# Patient Record
Sex: Female | Born: 1940 | Race: White | Hispanic: No | Marital: Married | Smoking: Never smoker
Health system: Southern US, Community
[De-identification: ages and names within clinical notes are randomized; demographics above are authoritative.]

## PROBLEM LIST (undated history)

## (undated) DIAGNOSIS — I10 Essential (primary) hypertension: Secondary | ICD-10-CM

## (undated) DIAGNOSIS — Z8489 Family history of other specified conditions: Secondary | ICD-10-CM

## (undated) DIAGNOSIS — E119 Type 2 diabetes mellitus without complications: Secondary | ICD-10-CM

## (undated) HISTORY — PX: CHOLECYSTECTOMY: SHX55

---

## 1990-03-21 HISTORY — PX: BREAST BIOPSY: SHX20

## 2016-12-14 ENCOUNTER — Other Ambulatory Visit: Payer: Self-pay | Admitting: Internal Medicine

## 2016-12-14 DIAGNOSIS — Z1231 Encounter for screening mammogram for malignant neoplasm of breast: Secondary | ICD-10-CM

## 2016-12-23 ENCOUNTER — Ambulatory Visit
Admission: RE | Admit: 2016-12-23 | Discharge: 2016-12-23 | Disposition: A | Discharge status: Home / Self Care | Payer: Medicare Other | Source: Ambulatory Visit | Attending: Internal Medicine | Admitting: Internal Medicine

## 2016-12-23 ENCOUNTER — Encounter: Payer: Self-pay | Admitting: Radiology

## 2016-12-23 DIAGNOSIS — Z1231 Encounter for screening mammogram for malignant neoplasm of breast: Secondary | ICD-10-CM | POA: Diagnosis present

## 2016-12-28 ENCOUNTER — Other Ambulatory Visit: Payer: Self-pay | Admitting: *Deleted

## 2016-12-28 ENCOUNTER — Inpatient Hospital Stay
Admission: RE | Admit: 2016-12-28 | Discharge: 2016-12-28 | Disposition: A | Discharge status: Home / Self Care | Payer: Self-pay | Source: Ambulatory Visit | Attending: *Deleted | Admitting: *Deleted

## 2016-12-28 DIAGNOSIS — Z9289 Personal history of other medical treatment: Secondary | ICD-10-CM

## 2017-02-25 ENCOUNTER — Emergency Department: Payer: Medicare Other

## 2017-02-25 ENCOUNTER — Emergency Department
Admission: EM | Admit: 2017-02-25 | Discharge: 2017-02-25 | Disposition: A | Discharge status: Home / Self Care | Payer: Medicare Other | Attending: Emergency Medicine | Admitting: Emergency Medicine

## 2017-02-25 DIAGNOSIS — W010XXA Fall on same level from slipping, tripping and stumbling without subsequent striking against object, initial encounter: Secondary | ICD-10-CM | POA: Diagnosis not present

## 2017-02-25 DIAGNOSIS — S52532A Colles' fracture of left radius, initial encounter for closed fracture: Secondary | ICD-10-CM | POA: Diagnosis not present

## 2017-02-25 DIAGNOSIS — S52612A Displaced fracture of left ulna styloid process, initial encounter for closed fracture: Secondary | ICD-10-CM | POA: Diagnosis not present

## 2017-02-25 DIAGNOSIS — Y998 Other external cause status: Secondary | ICD-10-CM | POA: Insufficient documentation

## 2017-02-25 DIAGNOSIS — Y9239 Other specified sports and athletic area as the place of occurrence of the external cause: Secondary | ICD-10-CM | POA: Insufficient documentation

## 2017-02-25 DIAGNOSIS — Y9354 Activity, bowling: Secondary | ICD-10-CM | POA: Diagnosis not present

## 2017-02-25 DIAGNOSIS — S6992XA Unspecified injury of left wrist, hand and finger(s), initial encounter: Secondary | ICD-10-CM | POA: Diagnosis present

## 2017-02-25 MED ORDER — HYDROCODONE-ACETAMINOPHEN 5-325 MG PO TABS: 1.0000 | ORAL_TABLET | ORAL | 0 refills | Status: DC | PRN

## 2017-02-25 MED ORDER — HYDROCODONE-ACETAMINOPHEN 5-325 MG PO TABS
1.0000 | ORAL_TABLET | Freq: Once | ORAL | Status: DC
  Filled 2017-02-25: qty 1

## 2017-02-25 NOTE — ED Notes (Signed)
Pt verbalizes understanding of the splinting process and acknowledges the adverse events that "can" occur with the splinting process,. Pt has been educated on what to do if any adverse events occur while the splint is on. Pt states at this time that they understand the splint related education and have no further questions at this time. Pt states that the splint feels comfortable/ pts O2 stats were WDL. Cap refill <3secs

## 2017-02-25 NOTE — ED Provider Notes (Signed)
[facility] Emergency Department Provider Note  ____________________________________________  Time seen: Approximately 10:02 PM  I have reviewed the triage vital signs and the nursing notes.   HISTORY  Chief Complaint Wrist Pain    HPI Jamie Fry is a 76 y.o. female who presents the emergency department complaining of left wrist pain and swelling.  Patient was bowling when she tripped, fell backwards landing on an outstretched hand.  Patient reports that she had swelling and pain to the distal radius.  Patient still has good range of motion to the left hand, no numbness or tingling to any of the digits.  In her fall, patient denied hit her head or lose consciousness.  Denies any other pain complaints at this time.  No medications were taken prior to arrival.  History reviewed. No pertinent past medical history.  There are no active problems to display for this patient.   Past Surgical History:  Procedure Laterality Date  . BREAST BIOPSY Left 1992   negative  . CHOLECYSTECTOMY      Prior to Admission medications   Medication Sig Start Date End Date Taking? Authorizing Provider  HYDROcodone-acetaminophen (NORCO/VICODIN) 5-325 MG tablet Take 1 tablet by mouth every 4 (four) hours as needed for moderate pain. 02/25/17   Urania Pearlman, Delorise RoyalsJonathan D, PA-C    Allergies Patient has no known allergies.  No family history on file.  Social History Social History   Tobacco Use  . Smoking status: Never Smoker  . Smokeless tobacco: Never Used  Substance Use Topics  . Alcohol use: No    Frequency: Never  . Drug use: No     Review of Systems  Constitutional: No fever/chills Eyes: No visual changes.  Cardiovascular: no chest pain. Respiratory: no cough. No SOB. Gastrointestinal: No abdominal pain.  No nausea, no vomiting.   Musculoskeletal: Positive for left wrist pain Skin: Negative for rash, abrasions, lacerations, ecchymosis. Neurological: Negative  for headaches, focal weakness or numbness. 10-point ROS otherwise negative.  ____________________________________________   PHYSICAL EXAM:  VITAL SIGNS: ED Triage Vitals  Enc Vitals Group     BP 02/25/17 2126 (!) 187/90     Pulse Rate 02/25/17 2126 92     Resp 02/25/17 2126 15     Temp 02/25/17 2126 (!) 97.5 F (36.4 C)     Temp src --      SpO2 02/25/17 2126 98 %     Weight 02/25/17 2127 136 lb (61.7 kg)     Height 02/25/17 2127 5\' 3"  (1.6 m)     Head Circumference --      Peak Flow --      Pain Score 02/25/17 2126 2     Pain Loc --      Pain Edu? --      Excl. in GC? --      Constitutional: Alert and oriented. Well appearing and in no acute distress. Eyes: Conjunctivae are normal. PERRL. EOMI. Head: Atraumatic. Neck: No stridor.    Cardiovascular: Normal rate, regular rhythm. Normal S1 and S2.  Good peripheral circulation. Respiratory: Normal respiratory effort without tachypnea or retractions. Lungs CTAB. Good air entry to the bases with no decreased or absent breath sounds. Musculoskeletal: Full range of motion to all extremities. No gross deformities appreciated.  Patient does have mild edema noted to the distal left forearm.  No gross deformity or ecchymosis.  Patient still has good range of motion to the left wrist and all digits left hand.  Patient is tender to  palpation over the distal radius with no palpable abnormality.  No other tenderness to palpation.  Palpation over the carpal bones reveals no tenderness.  Capillary refill intact all 5 digits.  Sensation intact all 5 digits. Neurologic:  Normal speech and language. No gross focal neurologic deficits are appreciated.  Skin:  Skin is warm, dry and intact. No rash noted. Psychiatric: Mood and affect are normal. Speech and behavior are normal. Patient exhibits appropriate insight and judgement.   ____________________________________________   LABS (all labs ordered are listed, but only abnormal results are  displayed)  Labs Reviewed - No data to display ____________________________________________  EKG   ____________________________________________  RADIOLOGY Festus Barren Alieu Finnigan, personally viewed and evaluated these images (plain radiographs) as part of my medical decision making, as well as reviewing the written report by the radiologist.  Dg Wrist Complete Left  Result Date: 02/25/2017 CLINICAL DATA:  Slip [place] fall [place] bowling [place]. EXAM: LEFT WRIST - COMPLETE 3+ VIEW COMPARISON:  None. FINDINGS: Acute transverse fracture through distal radius with suspected intra-articular extension, impaction with dorsal angulation distal bony fragments. Acute minimally displaced ulnar styloid fracture. No dislocation. No destructive bony lesions. Osteopenia. Dorsal wrist soft tissue swelling without subcutaneous gas or radiopaque foreign bodies. IMPRESSION: Acute displaced distal radioulnar styloid fractures. No dislocation. Electronically Signed   By: Awilda Metro M.D.   On: 02/25/2017 22:09    ____________________________________________    PROCEDURES  Procedure(s) performed:    .Splint Application Date/Time: 02/25/2017 10:20 PM Performed by: Racheal Patches, PA-C Authorized by: Racheal Patches, PA-C   Consent:    Consent obtained:  Verbal   Consent given by:  Patient   Risks discussed:  Pain and swelling Pre-procedure details:    Sensation:  Normal Procedure details:    Laterality:  Left   Location:  Wrist   Wrist:  L wrist   Splint type:  Volar short arm   Supplies:  Cotton padding, Ortho-Glass and elastic bandage Post-procedure details:    Pain:  Improved   Sensation:  Normal   Patient tolerance of procedure:  Tolerated well, no immediate complications      Medications  HYDROcodone-acetaminophen (NORCO/VICODIN) 5-325 MG per tablet 1 tablet (not administered)     ____________________________________________   INITIAL IMPRESSION / ASSESSMENT AND  PLAN / ED COURSE  Pertinent labs & imaging results that were available during my care of the patient were reviewed by me and considered in my medical decision making (see chart for details).  Review of the [place] CSRS was performed in accordance of the [facility] prior to dispensing any controlled drugs.     Patient's diagnosis is consistent with fall resulting in distal radius and ulnar styloid fractures.  Differential included sprain versus contusion versus fracture.  X-ray reveals the above fractures.  Patient is in relatively limited amount of pain at this time.  Patient's wrist is splinted as described above.. Patient will be discharged home with prescriptions for Vicodin for pain relief.  She is to follow-up with orthopedics for further management of distal radius and ulnar styloid fracture..  Patient is given ED precautions to return to the ED for any worsening or new symptoms.     ____________________________________________  FINAL CLINICAL IMPRESSION(S) / ED DIAGNOSES  Final diagnoses:  Closed Colles' fracture of left radius, initial encounter  Closed displaced fracture of styloid process of left ulna, initial encounter      NEW MEDICATIONS STARTED DURING THIS VISIT:  ED Discharge Orders  Ordered    HYDROcodone-acetaminophen (NORCO/VICODIN) 5-325 MG tablet  Every 4 hours PRN     02/25/17 2218          This chart was dictated using voice recognition software/Dragon. Despite best efforts to proofread, errors can occur which can change the meaning. Any change was purely unintentional.    Racheal PatchesCuthriell, Lilit Cinelli D, PA-C 02/25/17 2220    Emily FilbertWilliams, Stepan Verrette E, MD 02/25/17 2306

## 2017-02-25 NOTE — ED Triage Notes (Addendum)
Patient reports she slipped at bowling alley and fell back onto left hand. Patient c/o left wrist pain. Swelling noted to area.

## 2017-03-02 ENCOUNTER — Ambulatory Visit: Payer: Medicare Other | Admitting: Anesthesiology

## 2017-03-02 ENCOUNTER — Encounter
Admission: RE | Disposition: A | Discharge status: Home / Self Care | Payer: Self-pay | Source: Ambulatory Visit | Attending: Orthopedic Surgery

## 2017-03-02 ENCOUNTER — Encounter: Payer: Self-pay | Admitting: Emergency Medicine

## 2017-03-02 ENCOUNTER — Ambulatory Visit
Admission: RE | Admit: 2017-03-02 | Discharge: 2017-03-02 | Disposition: A | Discharge status: Home / Self Care | Payer: Medicare Other | Source: Ambulatory Visit | Attending: Orthopedic Surgery | Admitting: Orthopedic Surgery

## 2017-03-02 ENCOUNTER — Ambulatory Visit: Payer: Medicare Other

## 2017-03-02 DIAGNOSIS — Z7984 Long term (current) use of oral hypoglycemic drugs: Secondary | ICD-10-CM | POA: Insufficient documentation

## 2017-03-02 DIAGNOSIS — Z9889 Other specified postprocedural states: Secondary | ICD-10-CM

## 2017-03-02 DIAGNOSIS — Z7982 Long term (current) use of aspirin: Secondary | ICD-10-CM | POA: Insufficient documentation

## 2017-03-02 DIAGNOSIS — Z79899 Other long term (current) drug therapy: Secondary | ICD-10-CM | POA: Insufficient documentation

## 2017-03-02 DIAGNOSIS — E119 Type 2 diabetes mellitus without complications: Secondary | ICD-10-CM | POA: Diagnosis not present

## 2017-03-02 DIAGNOSIS — I10 Essential (primary) hypertension: Secondary | ICD-10-CM | POA: Diagnosis not present

## 2017-03-02 DIAGNOSIS — Y998 Other external cause status: Secondary | ICD-10-CM | POA: Diagnosis not present

## 2017-03-02 DIAGNOSIS — Y9354 Activity, bowling: Secondary | ICD-10-CM | POA: Diagnosis not present

## 2017-03-02 DIAGNOSIS — S52572A Other intraarticular fracture of lower end of left radius, initial encounter for closed fracture: Secondary | ICD-10-CM | POA: Insufficient documentation

## 2017-03-02 DIAGNOSIS — Y9239 Other specified sports and athletic area as the place of occurrence of the external cause: Secondary | ICD-10-CM | POA: Insufficient documentation

## 2017-03-02 DIAGNOSIS — W010XXA Fall on same level from slipping, tripping and stumbling without subsequent striking against object, initial encounter: Secondary | ICD-10-CM | POA: Insufficient documentation

## 2017-03-02 DIAGNOSIS — Z8781 Personal history of (healed) traumatic fracture: Secondary | ICD-10-CM

## 2017-03-02 HISTORY — DX: Essential (primary) hypertension: I10

## 2017-03-02 HISTORY — DX: Type 2 diabetes mellitus without complications: E11.9

## 2017-03-02 HISTORY — PX: OPEN REDUCTION INTERNAL FIXATION (ORIF) DISTAL RADIAL FRACTURE: SHX5989

## 2017-03-02 LAB — GLUCOSE, CAPILLARY
GLUCOSE-CAPILLARY: 98 mg/dL (ref 65–99)
Glucose-Capillary: 91 mg/dL (ref 65–99)

## 2017-03-02 SURGERY — OPEN REDUCTION INTERNAL FIXATION (ORIF) DISTAL RADIUS FRACTURE
Anesthesia: General | Site: Wrist | Laterality: Left | Wound class: Clean

## 2017-03-02 MED ORDER — LIDOCAINE HCL (CARDIAC) 20 MG/ML IV SOLN
INTRAVENOUS | Status: DC | PRN
  Administered 2017-03-02: 100 mg via INTRAVENOUS

## 2017-03-02 MED ORDER — GLYCOPYRROLATE 0.2 MG/ML IJ SOLN
INTRAMUSCULAR | Status: AC
  Filled 2017-03-02: qty 1

## 2017-03-02 MED ORDER — OXYCODONE HCL 5 MG/5ML PO SOLN: 5.0000 mg | Freq: Once | ORAL | Status: AC | PRN

## 2017-03-02 MED ORDER — FAMOTIDINE 20 MG PO TABS
20.0000 mg | ORAL_TABLET | Freq: Once | ORAL | Status: AC
  Administered 2017-03-02: 20 mg via ORAL

## 2017-03-02 MED ORDER — PROPOFOL 10 MG/ML IV BOLUS
INTRAVENOUS | Status: AC
Start: 2017-03-02 — End: ?
  Filled 2017-03-02: qty 20

## 2017-03-02 MED ORDER — LACTATED RINGERS IV SOLN
INTRAVENOUS | Status: DC | PRN
  Administered 2017-03-02: 16:00:00 via INTRAVENOUS

## 2017-03-02 MED ORDER — ONDANSETRON HCL 4 MG/2ML IJ SOLN
INTRAMUSCULAR | Status: DC | PRN
  Administered 2017-03-02: 4 mg via INTRAVENOUS

## 2017-03-02 MED ORDER — OXYCODONE HCL 5 MG PO TABS
ORAL_TABLET | ORAL | Status: AC
  Filled 2017-03-02: qty 1

## 2017-03-02 MED ORDER — CEFAZOLIN SODIUM-DEXTROSE 2-4 GM/100ML-% IV SOLN
INTRAVENOUS | Status: AC
  Filled 2017-03-02: qty 100

## 2017-03-02 MED ORDER — FENTANYL CITRATE (PF) 100 MCG/2ML IJ SOLN
INTRAMUSCULAR | Status: AC
  Administered 2017-03-02: 50 ug via INTRAVENOUS
  Filled 2017-03-02: qty 2

## 2017-03-02 MED ORDER — ACETAMINOPHEN 10 MG/ML IV SOLN
INTRAVENOUS | Status: AC
  Filled 2017-03-02: qty 100

## 2017-03-02 MED ORDER — SODIUM CHLORIDE 0.9 % IV SOLN
INTRAVENOUS | Status: DC
  Administered 2017-03-02: 14:00:00 via INTRAVENOUS

## 2017-03-02 MED ORDER — GLYCOPYRROLATE 0.2 MG/ML IJ SOLN
INTRAMUSCULAR | Status: DC | PRN
  Administered 2017-03-02: 0.2 mg via INTRAVENOUS

## 2017-03-02 MED ORDER — FENTANYL CITRATE (PF) 100 MCG/2ML IJ SOLN
INTRAMUSCULAR | Status: AC
  Filled 2017-03-02: qty 2

## 2017-03-02 MED ORDER — DEXAMETHASONE SODIUM PHOSPHATE 10 MG/ML IJ SOLN
INTRAMUSCULAR | Status: DC | PRN
  Administered 2017-03-02: 10 mg via INTRAVENOUS

## 2017-03-02 MED ORDER — LIDOCAINE HCL (PF) 2 % IJ SOLN
INTRAMUSCULAR | Status: AC
  Filled 2017-03-02: qty 10

## 2017-03-02 MED ORDER — NEOMYCIN-POLYMYXIN B GU 40-200000 IR SOLN
Status: DC | PRN
  Administered 2017-03-02: 2 mL

## 2017-03-02 MED ORDER — FAMOTIDINE 20 MG PO TABS
ORAL_TABLET | ORAL | Status: AC
  Administered 2017-03-02: 20 mg via ORAL
  Filled 2017-03-02: qty 1

## 2017-03-02 MED ORDER — PROPOFOL 10 MG/ML IV BOLUS
INTRAVENOUS | Status: DC | PRN
  Administered 2017-03-02: 50 mg via INTRAVENOUS
  Administered 2017-03-02: 150 mg via INTRAVENOUS

## 2017-03-02 MED ORDER — KETOROLAC TROMETHAMINE 30 MG/ML IJ SOLN
INTRAMUSCULAR | Status: AC
  Filled 2017-03-02: qty 1

## 2017-03-02 MED ORDER — HYDROCODONE-ACETAMINOPHEN 5-325 MG PO TABS: 1.0000 | ORAL_TABLET | Freq: Four times a day (QID) | ORAL | 0 refills | Status: DC | PRN

## 2017-03-02 MED ORDER — KETOROLAC TROMETHAMINE 30 MG/ML IJ SOLN
INTRAMUSCULAR | Status: DC | PRN
  Administered 2017-03-02: 30 mg via INTRAVENOUS

## 2017-03-02 MED ORDER — ACETAMINOPHEN 10 MG/ML IV SOLN
INTRAVENOUS | Status: DC | PRN
  Administered 2017-03-02: 1000 mg via INTRAVENOUS

## 2017-03-02 MED ORDER — CEFAZOLIN SODIUM-DEXTROSE 2-4 GM/100ML-% IV SOLN
2.0000 g | Freq: Once | INTRAVENOUS | Status: AC
  Administered 2017-03-02: 2 g via INTRAVENOUS

## 2017-03-02 MED ORDER — OXYCODONE HCL 5 MG PO TABS
5.0000 mg | ORAL_TABLET | Freq: Once | ORAL | Status: AC | PRN
  Administered 2017-03-02: 5 mg via ORAL

## 2017-03-02 MED ORDER — FENTANYL CITRATE (PF) 100 MCG/2ML IJ SOLN
25.0000 ug | INTRAMUSCULAR | Status: DC | PRN
  Administered 2017-03-02 (×3): 50 ug via INTRAVENOUS

## 2017-03-02 MED ORDER — FENTANYL CITRATE (PF) 100 MCG/2ML IJ SOLN
INTRAMUSCULAR | Status: DC | PRN
  Administered 2017-03-02: 50 ug via INTRAVENOUS
  Administered 2017-03-02: 100 ug via INTRAVENOUS

## 2017-03-02 SURGICAL SUPPLY — 38 items
BANDAGE ACE 4X5 VEL STRL LF (GAUZE/BANDAGES/DRESSINGS) ×3 IMPLANT
BIT DRILL 2 FAST STEP (BIT) ×3 IMPLANT
BIT DRILL 2.5X4 QC (BIT) ×3 IMPLANT
CANISTER SUCT 1200ML W/VALVE (MISCELLANEOUS) ×3 IMPLANT
CHLORAPREP W/TINT 26ML (MISCELLANEOUS) ×3 IMPLANT
CUFF TOURN 18 STER (MISCELLANEOUS) IMPLANT
DRAPE FLUOR MINI C-ARM 54X84 (DRAPES) ×3 IMPLANT
ELECT REM PT RETURN 9FT ADLT (ELECTROSURGICAL) ×3
ELECTRODE REM PT RTRN 9FT ADLT (ELECTROSURGICAL) ×1 IMPLANT
GAUZE PETRO XEROFOAM 1X8 (MISCELLANEOUS) ×6 IMPLANT
GAUZE SPONGE 4X4 12PLY STRL (GAUZE/BANDAGES/DRESSINGS) ×3 IMPLANT
GLOVE SURG SYN 9.0  PF PI (GLOVE) ×2
GLOVE SURG SYN 9.0 PF PI (GLOVE) ×1 IMPLANT
GOWN SRG 2XL LVL 4 RGLN SLV (GOWNS) ×1 IMPLANT
GOWN STRL NON-REIN 2XL LVL4 (GOWNS) ×2
GOWN STRL REUS W/ TWL LRG LVL3 (GOWN DISPOSABLE) ×1 IMPLANT
GOWN STRL REUS W/TWL LRG LVL3 (GOWN DISPOSABLE) ×2
K-WIRE 1.6 (WIRE) ×2
K-WIRE FX5X1.6XNS BN SS (WIRE) ×1
KIT RM TURNOVER STRD PROC AR (KITS) ×3 IMPLANT
KWIRE FX5X1.6XNS BN SS (WIRE) ×1 IMPLANT
NEEDLE FILTER BLUNT 18X 1/2SAF (NEEDLE) ×2
NEEDLE FILTER BLUNT 18X1 1/2 (NEEDLE) ×1 IMPLANT
NS IRRIG 500ML POUR BTL (IV SOLUTION) ×3 IMPLANT
PACK EXTREMITY ARMC (MISCELLANEOUS) ×3 IMPLANT
PAD CAST CTTN 4X4 STRL (SOFTGOODS) ×2 IMPLANT
PADDING CAST COTTON 4X4 STRL (SOFTGOODS) ×4
PEG SUBCHONDRAL SMOOTH 2.0X14 (Peg) ×3 IMPLANT
PEG SUBCHONDRAL SMOOTH 2.0X20 (Peg) ×6 IMPLANT
PEG SUBCHONDRAL SMOOTH 2.0X22 (Peg) ×9 IMPLANT
PLATE SHORT 21.6X48.9 NRRW LT (Plate) ×3 IMPLANT
SCREW CORT 3.5X10 LNG (Screw) ×9 IMPLANT
SPLINT CAST 1 STEP 3X12 (MISCELLANEOUS) ×3 IMPLANT
SUT ETHILON 4-0 (SUTURE) ×2
SUT ETHILON 4-0 FS2 18XMFL BLK (SUTURE) ×1
SUT VICRYL 3-0 27IN (SUTURE) ×3 IMPLANT
SUTURE ETHLN 4-0 FS2 18XMF BLK (SUTURE) ×1 IMPLANT
SYR 3ML LL SCALE MARK (SYRINGE) ×3 IMPLANT

## 2017-03-02 NOTE — Anesthesia Preprocedure Evaluation (Signed)
Anesthesia Evaluation  Patient identified by MRN, date of birth, ID band Patient awake    Reviewed: Allergy & Precautions, H&P , NPO status , Patient's Chart, lab work & pertinent test results  History of Anesthesia Complications Negative for: history of anesthetic complications  Airway Mallampati: III  TM Distance: >3 FB Neck ROM: full    Dental  (+) Chipped   Pulmonary neg pulmonary ROS, neg shortness of breath,           Cardiovascular Exercise Tolerance: Good hypertension, (-) angina(-) Past MI and (-) DOE      Neuro/Psych negative neurological ROS  negative psych ROS   GI/Hepatic negative GI ROS, Neg liver ROS,   Endo/Other  diabetes, Type 2  Renal/GU      Musculoskeletal   Abdominal   Peds  Hematology negative hematology ROS (+)   Anesthesia Other Findings Past Medical History: No date: Diabetes mellitus without complication (HCC) No date: Hypertension  Past Surgical History: 1992: BREAST BIOPSY; Left     Comment:  negative No date: CHOLECYSTECTOMY  BMI    Body Mass Index:  24.09 kg/m      Reproductive/Obstetrics negative OB ROS                             Anesthesia Physical Anesthesia Plan  ASA: III  Anesthesia Plan: General   Post-op Pain Management:    Induction: Intravenous  PONV Risk Score and Plan: 2 and Ondansetron, Dexamethasone and Midazolam  Airway Management Planned: LMA  Additional Equipment:   Intra-op Plan:   Post-operative Plan: Extubation in OR  Informed Consent: I have reviewed the patients History and Physical, chart, labs and discussed the procedure including the risks, benefits and alternatives for the proposed anesthesia with the patient or authorized representative who has indicated his/her understanding and acceptance.   Dental Advisory Given  Plan Discussed with: Anesthesiologist, CRNA and Surgeon  Anesthesia Plan Comments:  (Patient consented for risks of anesthesia including but not limited to:  - adverse reactions to medications - damage to teeth, lips or other oral mucosa - sore throat or hoarseness - Damage to heart, brain, lungs or loss of life  Patient voiced understanding.)        Anesthesia Quick Evaluation

## 2017-03-02 NOTE — Transfer of Care (Signed)
Immediate Anesthesia Transfer of Care Note  Patient: Jamie Fry  Procedure(s) Performed: OPEN REDUCTION INTERNAL FIXATION (ORIF) DISTAL RADIAL FRACTURE (Left Wrist)  Patient Location: PACU  Anesthesia Type:General  Level of Consciousness: sedated  Airway & Oxygen Therapy: Patient Spontanous Breathing and Patient connected to face mask oxygen  Post-op Assessment: Report given to RN and Post -op Vital signs reviewed and stable  Post vital signs: Reviewed and stable  Last Vitals:  Vitals:   03/02/17 1417  BP: (!) 181/84  Pulse: 95  Resp: 17  Temp: 36.7 C  SpO2: 98%    Last Pain:  Vitals:   03/02/17 1417  TempSrc: Oral         Complications: No apparent anesthesia complications

## 2017-03-02 NOTE — Anesthesia Postprocedure Evaluation (Signed)
Anesthesia Post Note  Patient: Jamie Fry  Procedure(s) Performed: OPEN REDUCTION INTERNAL FIXATION (ORIF) DISTAL RADIAL FRACTURE (Left Wrist)  Patient location during evaluation: PACU Anesthesia Type: General Level of consciousness: awake and alert Pain management: pain level controlled Vital Signs Assessment: post-procedure vital signs reviewed and stable Respiratory status: spontaneous breathing, nonlabored ventilation, respiratory function stable and patient connected to nasal cannula oxygen Cardiovascular status: blood pressure returned to baseline and stable Postop Assessment: no apparent nausea or vomiting Anesthetic complications: no     Last Vitals:  Vitals:   03/02/17 1814 03/02/17 1907  BP: (!) 158/84 (!) 157/79  Pulse: 84 74  Resp: 16   Temp: 36.7 C   SpO2: 99%     Last Pain:  Vitals:   03/02/17 1907  TempSrc:   PainSc: 3                  Lenard SimmerAndrew Maly Lemarr

## 2017-03-02 NOTE — Op Note (Signed)
03/02/2017  5:16 PM  PATIENT:  Jamie Fry  76 y.o. female  PRE-OPERATIVE DIAGNOSIS:  CLOSED DISPLACED FRACTURE OF LEFT RADIUS, intra-articular 2 fragments  POST-OPERATIVE DIAGNOSIS:  CLOSED DISPLACED FRACTURE OF LEFT RADIUS same  PROCEDURE:  Procedure(s): OPEN REDUCTION INTERNAL FIXATION (ORIF) DISTAL RADIAL FRACTURE (Left)  SURGEON: Leitha SchullerMichael J Faatimah Spielberg, MD  ASSISTANTS: None  ANESTHESIA:   general  EBL:  Total I/O In: 500 [I.V.:500] Out: 5 [Blood:5]  BLOOD ADMINISTERED:none  DRAINS: none   LOCAL MEDICATIONS USED:  NONE  SPECIMEN:  No Specimen  DISPOSITION OF SPECIMEN:  N/A  COUNTS:  YES  TOURNIQUET:  * Missing tourniquet times found for documented tourniquets in log: [id number] *20 minutes at 250 mmHg  IMPLANTS: Biomet short narrow DVR plate with multiple smooth pegs and screws  DICTATION: .Dragon Dictation patient brought the operating room and after adequate anesthesia was obtained the left arm was prepped and draped in sterile fashion. After patient identification and timeout procedures were completed, tourniquet was raised to her 50 mmHg and traction was applied to the index and middle fingers off the end of the table. A volar approach was utilized centered over the FCR tendon and the tendon sheath incised with the tendon retracted radially. The deep fascia was incised and the pronator elevated off the distal shaft and for distal fragment. With traction applied the reduction still did not get the volar tilt restored so distal first technique was utilized with the plate applied distally and multiple smooth pegs placed into the distal fragment. With the plate was brought down to the shaft anatomic reduction of the joint surface was obtained. Care was taken to visualize the distal radius and oblique fashion so that the was quite certain of the pegs were situated inside the joint. After placing all the smooth pegs using standard technique and putting 310 mm cortical shaft screws  in the traction was removed and under fluoroscopic exam the fracture was stable. The wound was irrigated and tourniquet let down. Wound was closed with 3-0 Vicryl substantially and 4-0 nylon for the skin. Xeroform 4 x 4 web roll and a volar splint were then applied followed by an Ace wrap  PLAN OF CARE: Discharge to home after PACU  PATIENT DISPOSITION:  PACU - hemodynamically stable.

## 2017-03-02 NOTE — Discharge Instructions (Addendum)
Keep arm elevated is much as possible. Ice to the back of the wrist today and tomorrow. Pain medicine as directed. Keep dressing clean and dry. Work fingers is much as you can  AMBULATORY SURGERY  DISCHARGE INSTRUCTIONS   1) The drugs that you were given will stay in your system until tomorrow so for the next 24 hours you should not:  A) Drive an automobile B) Make any legal decisions C) Drink any alcoholic beverage   2) You may resume regular meals tomorrow.  Today it is better to start with liquids and gradually work up to solid foods.  You may eat anything you prefer, but it is better to start with liquids, then soup and crackers, and gradually work up to solid foods.   3) Please notify your doctor immediately if you have any unusual bleeding, trouble breathing, redness and pain at the surgery site, drainage, fever, or pain not relieved by medication.    4) Additional Instructions:        Please contact your physician with any problems or Same Day Surgery at [phone], Monday through Friday 6 am to 4 pm, or [place] at [facility] Main number at [phone].

## 2017-03-02 NOTE — H&P (Signed)
Reviewed paper H+P, will be scanned into chart. No changes noted.  

## 2017-03-02 NOTE — Anesthesia Post-op Follow-up Note (Signed)
Anesthesia QCDR form completed.        

## 2017-03-02 NOTE — Anesthesia Procedure Notes (Signed)
Procedure Name: LMA Insertion Date/Time: 03/02/2017 4:05 PM Performed by: Junious SilkNoles, Kobey Sides, CRNA Pre-anesthesia Checklist: Patient identified, Patient being monitored, Timeout performed, Emergency Drugs available and Suction available Patient Re-evaluated:Patient Re-evaluated prior to induction Oxygen Delivery Method: Circle system utilized Preoxygenation: Pre-oxygenation with 100% oxygen Ventilation: Mask ventilation without difficulty LMA: LMA inserted LMA Size: 4.0 Tube type: Oral Number of attempts: 1 Placement Confirmation: positive ETCO2 and breath sounds checked- equal and bilateral Tube secured with: Tape Dental Injury: Teeth and Oropharynx as per pre-operative assessment

## 2017-03-03 ENCOUNTER — Encounter: Payer: Self-pay | Admitting: Orthopedic Surgery

## 2018-01-09 ENCOUNTER — Other Ambulatory Visit: Payer: Self-pay | Admitting: Internal Medicine

## 2018-01-09 DIAGNOSIS — Z1231 Encounter for screening mammogram for malignant neoplasm of breast: Secondary | ICD-10-CM

## 2018-01-24 ENCOUNTER — Encounter: Payer: Self-pay | Admitting: Urology

## 2018-01-24 ENCOUNTER — Ambulatory Visit
Admission: RE | Admit: 2018-01-24 | Discharge: 2018-01-24 | Disposition: A | Discharge status: Home / Self Care | Payer: Medicare Other | Source: Ambulatory Visit | Attending: Urology | Admitting: Urology

## 2018-01-24 ENCOUNTER — Ambulatory Visit: Payer: Medicare Other | Admitting: Urology

## 2018-01-24 ENCOUNTER — Other Ambulatory Visit: Payer: Self-pay

## 2018-01-24 VITALS — BP 140/87 | HR 114 | Ht 63.0 in | Wt 138.7 lb

## 2018-01-24 DIAGNOSIS — N200 Calculus of kidney: Secondary | ICD-10-CM | POA: Diagnosis not present

## 2018-01-24 LAB — URINALYSIS, COMPLETE
Bilirubin, UA: NEGATIVE
Glucose, UA: NEGATIVE
Ketones, UA: NEGATIVE
Leukocytes, UA: NEGATIVE
NITRITE UA: NEGATIVE
Protein, UA: NEGATIVE
RBC UA: NEGATIVE
SPEC GRAV UA: 1.01 (ref 1.005–1.030)
UUROB: 0.2 mg/dL (ref 0.2–1.0)
pH, UA: 6 (ref 5.0–7.5)

## 2018-01-24 MED ORDER — NITROFURANTOIN MACROCRYSTAL 50 MG PO CAPS: 50.0000 mg | ORAL_CAPSULE | Freq: Every day | ORAL | 3 refills | Status: AC

## 2018-01-24 NOTE — Progress Notes (Signed)
   01/24/2018 3:36 PM   Jamie Fry [birth year] [id number]  Referring provider: Leotis Shames, MD [phone] [facility] [address] [facility] [place], [place] [address]  CC: Nephrolithiasis, recurrent UTIs, urinary urgency  HPI: I had the pleasure of meeting Jamie Fry today in consultation for the above from Dr. Thedore Mins.  She is a very healthy and active 77 year old female who reportedly was told she had a kidney stones in the past however she has never passed any stones or required any surgeries for stones.  There is no imaging available for me to review.  She also reports recurrent UTIs approximately 5/year over the course of the last 30 years.  She typically takes a self start nitrofurantoin when she feels a urinary infection is coming on.  Her typical symptoms are chills and dysuria.  She has reportedly had numerous cystoscopies in the past which were all normal.  She denies any gross hematuria or flank pain.  She also reports urinary urgency with nocturia 3-4 times per night.  She denies any significant stress incontinence.  She is minimally bothered by her urinary urgency.  Her and her husband recently moved to the area from [place].  PVR in clinic today 90 cc.   PMH: Past Medical History:  Diagnosis Date  . Diabetes mellitus without complication (HCC)   . Hypertension     Surgical History: Past Surgical History:  Procedure Laterality Date  . BREAST BIOPSY Left 1992   negative  . CHOLECYSTECTOMY    . OPEN REDUCTION INTERNAL FIXATION (ORIF) DISTAL RADIAL FRACTURE Left 03/02/2017   Procedure: OPEN REDUCTION INTERNAL FIXATION (ORIF) DISTAL RADIAL FRACTURE;  Surgeon: Kennedy Bucker, MD;  Location: [facility] ORS;  Service: Orthopedics;  Laterality: Left;    Allergies: No Known Allergies  Family History: Family History  Problem Relation Age of Onset  . Prostate cancer Neg Hx   . Kidney cancer Neg Hx   . Bladder Cancer Neg Hx     Social History:  reports that she has never  smoked. She has never used smokeless tobacco. She reports that she does not drink alcohol or use drugs.  ROS: Please see flowsheet from today's date for complete review of systems.  Physical Exam: BP 140/87   Pulse (!) 114   Ht 5\' 3"  (1.6 m)   Wt 138 lb 11.2 oz (62.9 kg)   BMI 24.57 kg/m    Constitutional:  Alert and oriented, No acute distress. Cardiovascular: No clubbing, cyanosis, or edema. Respiratory: Normal respiratory effort, no increased work of breathing. GI: Abdomen is soft, nontender, nondistended, no abdominal masses GU: No CVA tenderness Lymph: No cervical or inguinal lymphadenopathy. Skin: No rashes, bruises or suspicious lesions. Neurologic: Grossly intact, no focal deficits, moving all 4 extremities. Psychiatric: Normal mood and affect.  Laboratory Data: Urinalysis today 0 WBCs, 0 RBCs, no bacteria, nitrite negative  Pertinent Imaging: None to review  Assessment & Plan:   In summary, Jamie Fry is a healthy and active 77 year old female with reported history of kidney stones as well as recurrent UTIs.  We discussed strategies for prevention of UTIs at length including hydration, timed voiding, perineal hygiene, and cranberry supplementation.  Trial of Premarin cream for recurrent UTIs, prescription provided for 50 mg Macrodantin prophylaxis KUB to evaluate for stone burden  Sondra Come, MD  [facility] [address], [address] [place], [place] [address] [phone]

## 2018-01-25 ENCOUNTER — Telehealth: Payer: Self-pay | Admitting: Family Medicine

## 2018-01-25 NOTE — Telephone Encounter (Signed)
-----   Message from Sondra Come, MD sent at 01/25/2018  9:33 AM EST ----- She has some small stones in the right kidney. I agree we can just watch these and do not need to worry about them unless she develops new right sided flank or groin pain. Keeps scheduled follow up.  Legrand Rams, MD 01/25/2018

## 2018-01-25 NOTE — Telephone Encounter (Signed)
Patient notified and voiced understanding.

## 2018-02-07 ENCOUNTER — Ambulatory Visit
Admission: RE | Admit: 2018-02-07 | Discharge: 2018-02-07 | Disposition: A | Discharge status: Home / Self Care | Payer: Medicare Other | Source: Ambulatory Visit | Attending: Internal Medicine | Admitting: Internal Medicine

## 2018-02-07 DIAGNOSIS — Z1231 Encounter for screening mammogram for malignant neoplasm of breast: Secondary | ICD-10-CM | POA: Diagnosis not present

## 2019-01-22 ENCOUNTER — Ambulatory Visit: Payer: Medicare Other | Admitting: Urology

## 2019-02-07 ENCOUNTER — Ambulatory Visit: Payer: Medicare Other | Admitting: Urology

## 2019-02-18 ENCOUNTER — Other Ambulatory Visit: Payer: Self-pay

## 2019-02-18 DIAGNOSIS — Z20822 Contact with and (suspected) exposure to covid-19: Secondary | ICD-10-CM

## 2019-02-20 LAB — NOVEL CORONAVIRUS, NAA: SARS-CoV-2, NAA: NOT DETECTED

## 2019-02-21 ENCOUNTER — Telehealth: Payer: Self-pay | Admitting: Internal Medicine

## 2019-02-21 NOTE — Telephone Encounter (Signed)
Negative COVID results given. Patient results "NOT Detected." Caller expressed understanding. ° °

## 2019-05-02 ENCOUNTER — Ambulatory Visit: Payer: Medicare Other | Attending: Internal Medicine

## 2019-05-02 DIAGNOSIS — Z23 Encounter for immunization: Secondary | ICD-10-CM | POA: Insufficient documentation

## 2019-05-02 NOTE — Progress Notes (Signed)
   Covid-19 Vaccination Clinic  Name:  Jamie Fry    MRN: [id number] DOB: 19-Dec-1940  05/02/2019  Jamie Fry was observed post Covid-19 immunization for 15 minutes without incidence. She was provided with Vaccine Information Sheet and instruction to access the V-Safe system.   Jamie Fry was instructed to call 911 with any severe reactions post vaccine: Marland Kitchen Difficulty breathing  . Swelling of your face and throat  . A fast heartbeat  . A bad rash all over your body  . Dizziness and weakness    Immunizations Administered    Name Date Dose VIS Date Route   Pfizer COVID-19 Vaccine 05/02/2019  1:41 PM 0.3 mL 03/01/2019 Intramuscular   Manufacturer: [employer], [employer]   Lot: [id number]   NDC: [address]-4

## 2019-05-25 ENCOUNTER — Ambulatory Visit: Payer: Medicare Other | Attending: Internal Medicine

## 2019-05-25 DIAGNOSIS — Z23 Encounter for immunization: Secondary | ICD-10-CM | POA: Insufficient documentation

## 2019-05-25 NOTE — Progress Notes (Signed)
   Covid-19 Vaccination Clinic  Name:  Jamie Fry    MRN: [id number] DOB: Jun 04, 1940  05/25/2019  [name] was observed post Covid-19 immunization for 15 minutes without incident. She was provided with Vaccine Information Sheet and instruction to access the V-Safe system.   [name] was instructed to call 911 with any severe reactions post vaccine: Marland Kitchen Difficulty breathing  . Swelling of face and throat  . A fast heartbeat  . A bad rash all over body  . Dizziness and weakness   Immunizations Administered    Name Date Dose VIS Date Route   Pfizer COVID-19 Vaccine 05/25/2019 12:32 PM 0.3 mL 03/01/2019 Intramuscular   Manufacturer: [employer], [employer]   Lot: [id number]   NDC: [address]-9

## 2019-12-30 ENCOUNTER — Other Ambulatory Visit: Payer: Self-pay | Admitting: Internal Medicine

## 2019-12-30 DIAGNOSIS — Z1231 Encounter for screening mammogram for malignant neoplasm of breast: Secondary | ICD-10-CM

## 2019-12-31 ENCOUNTER — Ambulatory Visit
Admission: RE | Admit: 2019-12-31 | Discharge: 2019-12-31 | Disposition: A | Discharge status: Home / Self Care | Payer: Medicare Other | Source: Ambulatory Visit | Attending: Internal Medicine | Admitting: Internal Medicine

## 2019-12-31 ENCOUNTER — Other Ambulatory Visit: Payer: Self-pay

## 2019-12-31 DIAGNOSIS — Z1231 Encounter for screening mammogram for malignant neoplasm of breast: Secondary | ICD-10-CM | POA: Insufficient documentation

## 2021-05-19 DIAGNOSIS — U071 COVID-19: Secondary | ICD-10-CM

## 2021-05-19 HISTORY — DX: COVID-19: U07.1

## 2021-07-01 ENCOUNTER — Encounter: Payer: Self-pay | Admitting: Ophthalmology

## 2021-07-05 NOTE — Discharge Instructions (Signed)

## 2021-07-07 ENCOUNTER — Ambulatory Visit: Payer: Medicare Other | Admitting: Anesthesiology

## 2021-07-07 ENCOUNTER — Encounter
Admission: RE | Disposition: A | Discharge status: Home / Self Care | Payer: Self-pay | Source: Home / Self Care | Attending: Ophthalmology

## 2021-07-07 ENCOUNTER — Ambulatory Visit
Admission: RE | Admit: 2021-07-07 | Discharge: 2021-07-07 | Disposition: A | Discharge status: Home / Self Care | Payer: Medicare Other | Attending: Ophthalmology | Admitting: Ophthalmology

## 2021-07-07 ENCOUNTER — Other Ambulatory Visit: Payer: Self-pay

## 2021-07-07 ENCOUNTER — Encounter: Payer: Self-pay | Admitting: Ophthalmology

## 2021-07-07 DIAGNOSIS — H2511 Age-related nuclear cataract, right eye: Secondary | ICD-10-CM | POA: Diagnosis present

## 2021-07-07 DIAGNOSIS — E1136 Type 2 diabetes mellitus with diabetic cataract: Secondary | ICD-10-CM | POA: Insufficient documentation

## 2021-07-07 DIAGNOSIS — I10 Essential (primary) hypertension: Secondary | ICD-10-CM | POA: Diagnosis not present

## 2021-07-07 HISTORY — PX: CATARACT EXTRACTION W/PHACO: SHX586

## 2021-07-07 HISTORY — DX: Family history of other specified conditions: Z84.89

## 2021-07-07 LAB — GLUCOSE, CAPILLARY
Glucose-Capillary: 125 mg/dL — ABNORMAL HIGH (ref 70–99)
Glucose-Capillary: 116 mg/dL — ABNORMAL HIGH (ref 70–99)

## 2021-07-07 SURGERY — PHACOEMULSIFICATION, CATARACT, WITH IOL INSERTION
Anesthesia: Monitor Anesthesia Care | Site: Eye | Laterality: Right

## 2021-07-07 MED ORDER — ACETAMINOPHEN 325 MG PO TABS: 325.0000 mg | ORAL_TABLET | Freq: Once | ORAL | Status: DC

## 2021-07-07 MED ORDER — SIGHTPATH DOSE#1 BSS IO SOLN
INTRAOCULAR | Status: DC | PRN
  Administered 2021-07-07: 1 mL via INTRAMUSCULAR

## 2021-07-07 MED ORDER — ARMC OPHTHALMIC DILATING DROPS
1.0000 "application " | OPHTHALMIC | Status: DC | PRN
  Administered 2021-07-07 (×3): 1 via OPHTHALMIC

## 2021-07-07 MED ORDER — LACTATED RINGERS IV SOLN: INTRAVENOUS | Status: DC

## 2021-07-07 MED ORDER — FENTANYL CITRATE (PF) 100 MCG/2ML IJ SOLN
INTRAMUSCULAR | Status: DC | PRN
  Administered 2021-07-07: 50 ug via INTRAVENOUS

## 2021-07-07 MED ORDER — TETRACAINE HCL 0.5 % OP SOLN
1.0000 [drp] | OPHTHALMIC | Status: DC | PRN
  Administered 2021-07-07 (×3): 1 [drp] via OPHTHALMIC

## 2021-07-07 MED ORDER — MIDAZOLAM HCL 2 MG/2ML IJ SOLN
INTRAMUSCULAR | Status: DC | PRN
  Administered 2021-07-07: 1 mg via INTRAVENOUS

## 2021-07-07 MED ORDER — SIGHTPATH DOSE#1 BSS IO SOLN
INTRAOCULAR | Status: DC | PRN
  Administered 2021-07-07: 64 mL via OPHTHALMIC

## 2021-07-07 MED ORDER — CEFUROXIME OPHTHALMIC INJECTION 1 MG/0.1 ML
INJECTION | OPHTHALMIC | Status: DC | PRN
  Administered 2021-07-07: 1 mg via OPHTHALMIC

## 2021-07-07 MED ORDER — SIGHTPATH DOSE#1 NA HYALUR & NA CHOND-NA HYALUR IO KIT
PACK | INTRAOCULAR | Status: DC | PRN
  Administered 2021-07-07: 1 via OPHTHALMIC

## 2021-07-07 MED ORDER — SIGHTPATH DOSE#1 BSS IO SOLN
INTRAOCULAR | Status: DC | PRN
  Administered 2021-07-07: 15 mL

## 2021-07-07 MED ORDER — BRIMONIDINE TARTRATE-TIMOLOL 0.2-0.5 % OP SOLN
OPHTHALMIC | Status: DC | PRN
  Administered 2021-07-07: 1 [drp] via OPHTHALMIC

## 2021-07-07 MED ORDER — ACETAMINOPHEN 160 MG/5ML PO SOLN: 325.0000 mg | Freq: Once | ORAL | Status: DC

## 2021-07-07 SURGICAL SUPPLY — 11 items
CATARACT SUITE SIGHTPATH (MISCELLANEOUS) ×2 IMPLANT
FEE CATARACT SUITE SIGHTPATH (MISCELLANEOUS) ×1 IMPLANT
GLOVE SRG 8 PF TXTR STRL LF DI (GLOVE) ×1 IMPLANT
GLOVE SURG ENC TEXT LTX SZ7.5 (GLOVE) ×2 IMPLANT
GLOVE SURG UNDER POLY LF SZ8 (GLOVE) ×2
LENS IOL TECNIS EYHANCE 19.5 (Intraocular Lens) ×1 IMPLANT
NDL FILTER BLUNT 18X1 1/2 (NEEDLE) ×1 IMPLANT
NEEDLE FILTER BLUNT 18X 1/2SAF (NEEDLE) ×1
NEEDLE FILTER BLUNT 18X1 1/2 (NEEDLE) ×1 IMPLANT
SYR 3ML LL SCALE MARK (SYRINGE) ×2 IMPLANT
WATER STERILE IRR 250ML POUR (IV SOLUTION) ×2 IMPLANT

## 2021-07-07 NOTE — H&P (Signed)
?[facility]  ? ?Primary Care Physician:  Lauro Regulus, MD ?Ophthalmologist: Dr. Lockie Mola ? ?Pre-Procedure History & Physical: ?HPI:  Jamie Fry is a 81 y.o. female here for ophthalmic surgery. ?  ?Past Medical History:  ?Diagnosis Date  ? COVID-19 05/2021  ? Diabetes mellitus without complication (HCC)   ? Family history of adverse reaction to anesthesia   ? Mother and daughter - PONV  ? Hypertension   ? ? ?Past Surgical History:  ?Procedure Laterality Date  ? BREAST BIOPSY Left 1992  ? negative  ? CHOLECYSTECTOMY    ? OPEN REDUCTION INTERNAL FIXATION (ORIF) DISTAL RADIAL FRACTURE Left 03/02/2017  ? Procedure: OPEN REDUCTION INTERNAL FIXATION (ORIF) DISTAL RADIAL FRACTURE;  Surgeon: Kennedy Bucker, MD;  Location: [facility] ORS;  Service: Orthopedics;  Laterality: Left;  ? ? ?Prior to Admission medications   ?Medication Sig Start Date End Date Taking? Authorizing Provider  ?aspirin EC 81 MG tablet Take 81 mg by mouth daily.   Yes [provider]  ?atorvastatin (LIPITOR) 10 MG tablet Take 10 mg by mouth every evening.   Yes [provider]  ?CALCIUM PO Take 600 mg by mouth daily.   Yes [provider]  ?Cholecalciferol (VITAMIN D PO) Take 1 capsule by mouth daily.   Yes [provider]  ?Chromium Picolinate 200 MCG TABS Take by mouth daily.   Yes [provider]  ?Coenzyme Q10 (COQ10) 100 MG CAPS Take 100 mg by mouth daily.   Yes [provider]  ?Flaxseed, Linseed, (FLAX SEED OIL) 1300 MG CAPS Take 1,300 mg by mouth daily.   Yes [provider]  ?Glucosamine-Chondroit-Vit C-Mn (GLUCOSAMINE 1500 COMPLEX PO) Take 1 tablet by mouth daily.   Yes [provider]  ?ibuprofen (ADVIL,MOTRIN) 200 MG tablet Take 200-400 mg by mouth daily as needed for headache or moderate pain.   Yes [provider]  ?Lecithin 1200 MG CAPS Take 1,200 mg by mouth daily.   Yes [provider]  ?lisinopril (PRINIVIL,ZESTRIL) 5  MG tablet Take 7.5 mg by mouth daily.   Yes [provider]  ?metFORMIN (GLUCOPHAGE) 500 MG tablet Take 500 mg by mouth daily with breakfast. 01/27/17  Yes [provider]  ?Multiple Vitamin (MULTIVITAMIN WITH MINERALS) TABS tablet Take 1 tablet by mouth daily.   Yes [provider]  ?nitrofurantoin (MACRODANTIN) 50 MG capsule Take 1 capsule (50 mg total) by mouth daily. 01/24/18  Yes Sondra Come, MD  ?Omega-3 Fatty Acids (FISH OIL) 1000 MG CAPS Take 1,000 mg by mouth daily.   Yes [provider]  ?OVER THE COUNTER MEDICATION Beet root 1000 mg   Yes [provider]  ?vitamin C (ASCORBIC ACID) 500 MG tablet Take 500 mg by mouth daily.   Yes [provider]  ?vitamin E 400 UNIT capsule Take 400 Units by mouth daily.   Yes [provider]  ? ? ?Allergies as of 12/23/2020  ? (No Known Allergies)  ? ? ?Family History  ?Problem Relation Age of Onset  ? Prostate cancer Neg Hx   ? Kidney cancer Neg Hx   ? Bladder Cancer Neg Hx   ? Breast cancer Neg Hx   ? ? ?Social History  ? ?Socioeconomic History  ? Marital status: Married  ?  Spouse name: Not on file  ? Number of children: Not on file  ? Years of education: Not on file  ? Highest education level: Not on file  ?Occupational History  ?  Not on file  ?Tobacco Use  ? Smoking status: Never  ? Smokeless tobacco: Never  ?Vaping Use  ? Vaping Use: Never used  ?Substance and Sexual Activity  ? Alcohol use: No  ? Drug use: No  ? Sexual activity: Not on file  ?Other Topics Concern  ? Not on file  ?Social History Narrative  ? Not on file  ? ?Social Determinants of Health  ? ?Financial Resource Strain: Not on file  ?Food Insecurity: Not on file  ?Transportation Needs: Not on file  ?Physical Activity: Not on file  ?Stress: Not on file  ?Social Connections: Not on file  ?Intimate Partner Violence: Not on file  ? ? ?Review of Systems: ?See HPI, otherwise negative ROS ? ?Physical Exam: ?BP (!) 192/98   Pulse 93   Temp  97.9 ?F (36.6 ?C) (Temporal)   Resp 12   Ht 5\' 3"  (1.6 m)   Wt 61.7 kg   SpO2 96%   BMI 24.09 kg/m?  ?General:   Alert,  pleasant and cooperative in NAD ?Head:  Normocephalic and atraumatic. ?Lungs:  Clear to auscultation.    ?Heart:  Regular rate and rhythm.  ? ?Impression/Plan: ?Jamie Fry is here for ophthalmic surgery. ? ?Risks, benefits, limitations, and alternatives regarding ophthalmic surgery have been reviewed with the patient.  Questions have been answered.  All parties agreeable. ? ? Leandrew Koyanagi, MD  07/07/2021, 9:45 AM ? ?

## 2021-07-07 NOTE — Transfer of Care (Signed)
Immediate Anesthesia Transfer of Care Note ? ?Patient: Jamie Fry ? ?Procedure(s) Performed: CATARACT EXTRACTION PHACO AND INTRAOCULAR LENS PLACEMENT (IOC)RIGHT (Right: Eye) ? ?Patient Location: PACU ? ?Anesthesia Type: MAC ? ?Level of Consciousness: awake, alert  and patient cooperative ? ?Airway and Oxygen Therapy: Patient Spontanous Breathing and Patient connected to supplemental oxygen ? ?Post-op Assessment: Post-op Vital signs reviewed, Patient's Cardiovascular Status Stable, Respiratory Function Stable, Patent Airway and No signs of Nausea or vomiting ? ?Post-op Vital Signs: Reviewed and stable ? ?Complications: No notable events documented. ? ?

## 2021-07-07 NOTE — Anesthesia Preprocedure Evaluation (Signed)
Anesthesia Evaluation  ?Patient identified by MRN, date of birth, ID band ?Patient awake ? ? ? ?Reviewed: ?Allergy & Precautions, H&P , NPO status , Patient's Chart, lab work & pertinent test results ? ?Airway ?Mallampati: II ? ?TM Distance: >3 FB ?Neck ROM: full ? ? ? Dental ?no notable dental hx. ? ?  ?Pulmonary ? ?  ?Pulmonary exam normal ?breath sounds clear to auscultation ? ? ? ? ? ? Cardiovascular ?hypertension, Normal cardiovascular exam ?Rhythm:regular Rate:Normal ? ? ?  ?Neuro/Psych ?  ? GI/Hepatic ?  ?Endo/Other  ?diabetes ? Renal/GU ?  ? ?  ?Musculoskeletal ? ? Abdominal ?  ?Peds ? Hematology ?  ?Anesthesia Other Findings ? ? Reproductive/Obstetrics ? ?  ? ? ? ? ? ? ? ? ? ? ? ? ? ?  ?  ? ? ? ? ? ? ? ? ?Anesthesia Physical ?Anesthesia Plan ? ?ASA: 2 ? ?Anesthesia Plan: MAC  ? ?Post-op Pain Management: Minimal or no pain anticipated  ? ?Induction:  ? ?PONV Risk Score and Plan: 2 and Treatment may vary due to age or medical condition and TIVA ? ?Airway Management Planned:  ? ?Additional Equipment:  ? ?Intra-op Plan:  ? ?Post-operative Plan:  ? ?Informed Consent: I have reviewed the patients History and Physical, chart, labs and discussed the procedure including the risks, benefits and alternatives for the proposed anesthesia with the patient or authorized representative who has indicated his/her understanding and acceptance.  ? ? ? ?Dental Advisory Given ? ?Plan Discussed with: CRNA ? ?Anesthesia Plan Comments:   ? ? ? ? ? ? ?Anesthesia Quick Evaluation ? ?

## 2021-07-07 NOTE — Anesthesia Postprocedure Evaluation (Signed)
Anesthesia Post Note ? ?Patient: Jamie Fry ? ?Procedure(s) Performed: CATARACT EXTRACTION PHACO AND INTRAOCULAR LENS PLACEMENT (IOC)RIGHT (Right: Eye) ? ? ?  ?Patient location during evaluation: PACU ?Anesthesia Type: MAC ?Level of consciousness: awake and alert and oriented ?Pain management: satisfactory to patient ?Vital Signs Assessment: post-procedure vital signs reviewed and stable ?Respiratory status: spontaneous breathing, nonlabored ventilation and respiratory function stable ?Cardiovascular status: blood pressure returned to baseline and stable ?Postop Assessment: Adequate PO intake and No signs of nausea or vomiting ?Anesthetic complications: no ? ? ?No notable events documented. ? ?Raliegh Ip ? ? ? ? ? ?

## 2021-07-07 NOTE — Op Note (Signed)
LOCATION:  [place] ?  ?PREOPERATIVE DIAGNOSIS:    Nuclear sclerotic cataract right eye. H25.11 ?  ?POSTOPERATIVE DIAGNOSIS:  Nuclear sclerotic cataract right eye.   ?  ?PROCEDURE:  Phacoemusification with posterior chamber intraocular lens placement of the right eye  ? ?ULTRASOUND TIME: Procedure(s) with comments: ?CATARACT EXTRACTION PHACO AND INTRAOCULAR LENS PLACEMENT (IOC)RIGHT (Right) - Diabetic ?9.55 ?01:04.8 ? ?LENS:   ?Implant Name Type Inv. Item Serial No. Manufacturer Lot No. LRB No. Used Action  ?LENS IOL TECNIS EYHANCE 19.5 - [id number] Intraocular Lens LENS IOL TECNIS EYHANCE 19.5 [id number] [employer]  Right 1 Implanted  ?   ?  ?  ?SURGEON:  Wyonia Hough, MD ?  ?ANESTHESIA:  Topical with tetracaine drops and 2% Xylocaine jelly, augmented with 1% preservative-free intracameral lidocaine. ? ?  ?COMPLICATIONS:  None. ?  ?DESCRIPTION OF PROCEDURE:  The patient was identified in the holding room and transported to the operating room and placed in the supine position under the operating microscope.  The right eye was identified as the operative eye and it was prepped and draped in the usual sterile ophthalmic fashion. ?  ?A 1 millimeter clear-corneal paracentesis was made at the 12:00 position.  0.5 ml of preservative-free 1% lidocaine was injected into the anterior chamber. ?The anterior chamber was filled with Viscoat viscoelastic.  A 2.4 millimeter keratome was used to make a near-clear corneal incision at the 9:00 position.  A curvilinear capsulorrhexis was made with a cystotome and capsulorrhexis forceps.  Balanced salt solution was used to hydrodissect and hydrodelineate the nucleus. ?  ?Phacoemulsification was then used in stop and chop fashion to remove the lens nucleus and epinucleus.  The remaining cortex was then removed using the irrigation and aspiration handpiece. Provisc was then placed into the capsular bag to distend it for lens placement.  A lens was then injected  into the capsular bag.  The remaining viscoelastic was aspirated. ?  ?Wounds were hydrated with balanced salt solution.  The anterior chamber was inflated to a physiologic pressure with balanced salt solution.  No wound leaks were noted. Cefuroxime 0.1 ml of a 10mg /ml solution was injected into the anterior chamber for a dose of 1 mg of intracameral antibiotic at the completion of the case. ?  Timolol and Brimonidine drops were applied to the eye.  The patient was taken to the recovery room in stable condition without complications of anesthesia or surgery. ? ? ?Joeangel Jeanpaul ?07/07/2021, 10:36 AM ? ?

## 2021-07-08 ENCOUNTER — Other Ambulatory Visit: Payer: Self-pay

## 2021-07-08 ENCOUNTER — Encounter: Payer: Self-pay | Admitting: Ophthalmology

## 2021-07-19 NOTE — Discharge Instructions (Signed)

## 2021-07-21 ENCOUNTER — Ambulatory Visit
Admission: RE | Admit: 2021-07-21 | Discharge: 2021-07-21 | Disposition: A | Discharge status: Home / Self Care | Payer: Medicare Other | Attending: Ophthalmology | Admitting: Ophthalmology

## 2021-07-21 ENCOUNTER — Ambulatory Visit: Payer: Medicare Other | Admitting: Anesthesiology

## 2021-07-21 ENCOUNTER — Encounter: Payer: Self-pay | Admitting: Ophthalmology

## 2021-07-21 ENCOUNTER — Other Ambulatory Visit: Payer: Self-pay

## 2021-07-21 ENCOUNTER — Encounter
Admission: RE | Disposition: A | Discharge status: Home / Self Care | Payer: Self-pay | Source: Home / Self Care | Attending: Ophthalmology

## 2021-07-21 DIAGNOSIS — Z7984 Long term (current) use of oral hypoglycemic drugs: Secondary | ICD-10-CM | POA: Insufficient documentation

## 2021-07-21 DIAGNOSIS — I10 Essential (primary) hypertension: Secondary | ICD-10-CM | POA: Insufficient documentation

## 2021-07-21 DIAGNOSIS — E1136 Type 2 diabetes mellitus with diabetic cataract: Secondary | ICD-10-CM | POA: Insufficient documentation

## 2021-07-21 DIAGNOSIS — H2512 Age-related nuclear cataract, left eye: Secondary | ICD-10-CM | POA: Insufficient documentation

## 2021-07-21 HISTORY — PX: CATARACT EXTRACTION W/PHACO: SHX586

## 2021-07-21 LAB — GLUCOSE, CAPILLARY
Glucose-Capillary: 142 mg/dL — ABNORMAL HIGH (ref 70–99)
Glucose-Capillary: 156 mg/dL — ABNORMAL HIGH (ref 70–99)

## 2021-07-21 SURGERY — PHACOEMULSIFICATION, CATARACT, WITH IOL INSERTION
Anesthesia: Monitor Anesthesia Care | Site: Eye | Laterality: Left

## 2021-07-21 MED ORDER — SIGHTPATH DOSE#1 BSS IO SOLN
INTRAOCULAR | Status: DC | PRN
  Administered 2021-07-21: 82 mL via OPHTHALMIC

## 2021-07-21 MED ORDER — SIGHTPATH DOSE#1 NA HYALUR & NA CHOND-NA HYALUR IO KIT
PACK | INTRAOCULAR | Status: DC | PRN
  Administered 2021-07-21: 1 via OPHTHALMIC

## 2021-07-21 MED ORDER — TETRACAINE HCL 0.5 % OP SOLN
1.0000 [drp] | OPHTHALMIC | Status: DC | PRN
  Administered 2021-07-21 (×3): 1 [drp] via OPHTHALMIC

## 2021-07-21 MED ORDER — LACTATED RINGERS IV SOLN: INTRAVENOUS | Status: DC

## 2021-07-21 MED ORDER — ARMC OPHTHALMIC DILATING DROPS
1.0000 "application " | OPHTHALMIC | Status: DC | PRN
  Administered 2021-07-21 (×3): 1 via OPHTHALMIC

## 2021-07-21 MED ORDER — ACETAMINOPHEN 325 MG PO TABS: 325.0000 mg | ORAL_TABLET | Freq: Once | ORAL | Status: DC

## 2021-07-21 MED ORDER — SIGHTPATH DOSE#1 BSS IO SOLN
INTRAOCULAR | Status: DC | PRN
  Administered 2021-07-21: 15 mL

## 2021-07-21 MED ORDER — ACETAMINOPHEN 160 MG/5ML PO SOLN: 325.0000 mg | Freq: Once | ORAL | Status: DC

## 2021-07-21 MED ORDER — BRIMONIDINE TARTRATE-TIMOLOL 0.2-0.5 % OP SOLN
OPHTHALMIC | Status: DC | PRN
  Administered 2021-07-21: 1 [drp] via OPHTHALMIC

## 2021-07-21 MED ORDER — MIDAZOLAM HCL 2 MG/2ML IJ SOLN
INTRAMUSCULAR | Status: DC | PRN
  Administered 2021-07-21: 2 mg via INTRAVENOUS

## 2021-07-21 MED ORDER — FENTANYL CITRATE (PF) 100 MCG/2ML IJ SOLN
INTRAMUSCULAR | Status: DC | PRN
Start: 2021-07-21 — End: 2021-07-21
  Administered 2021-07-21: 50 ug via INTRAVENOUS

## 2021-07-21 MED ORDER — CEFUROXIME OPHTHALMIC INJECTION 1 MG/0.1 ML
INJECTION | OPHTHALMIC | Status: DC | PRN
  Administered 2021-07-21: 1 mg via OPHTHALMIC

## 2021-07-21 MED ORDER — SIGHTPATH DOSE#1 BSS IO SOLN
INTRAOCULAR | Status: DC | PRN
  Administered 2021-07-21: 1 mL via INTRAMUSCULAR

## 2021-07-21 SURGICAL SUPPLY — 11 items
CATARACT SUITE SIGHTPATH (MISCELLANEOUS) ×2 IMPLANT
FEE CATARACT SUITE SIGHTPATH (MISCELLANEOUS) ×1 IMPLANT
GLOVE SRG 8 PF TXTR STRL LF DI (GLOVE) ×1 IMPLANT
GLOVE SURG ENC TEXT LTX SZ7.5 (GLOVE) ×2 IMPLANT
GLOVE SURG UNDER POLY LF SZ8 (GLOVE) ×2
LENS IOL TECNIS EYHANCE 19.0 (Intraocular Lens) ×1 IMPLANT
NDL FILTER BLUNT 18X1 1/2 (NEEDLE) ×1 IMPLANT
NEEDLE FILTER BLUNT 18X 1/2SAF (NEEDLE) ×1
NEEDLE FILTER BLUNT 18X1 1/2 (NEEDLE) ×1 IMPLANT
SYR 3ML LL SCALE MARK (SYRINGE) ×2 IMPLANT
WATER STERILE IRR 250ML POUR (IV SOLUTION) ×2 IMPLANT

## 2021-07-21 NOTE — Anesthesia Preprocedure Evaluation (Signed)
Anesthesia Evaluation  ?Patient identified by MRN, date of birth, ID band ?Patient awake ? ? ? ?Reviewed: ?Allergy & Precautions, H&P , NPO status , Patient's Chart, lab work & pertinent test results ? ?Airway ?Mallampati: II ? ?TM Distance: >3 FB ?Neck ROM: full ? ? ? Dental ?no notable dental hx. ? ?  ?Pulmonary ? ?  ?Pulmonary exam normal ?breath sounds clear to auscultation ? ? ? ? ? ? Cardiovascular ?hypertension, Normal cardiovascular exam ?Rhythm:regular Rate:Normal ? ? ?  ?Neuro/Psych ?  ? GI/Hepatic ?  ?Endo/Other  ?diabetes ? Renal/GU ?  ? ?  ?Musculoskeletal ? ? Abdominal ?  ?Peds ? Hematology ?  ?Anesthesia Other Findings ? ? Reproductive/Obstetrics ? ?  ? ? ? ? ? ? ? ? ? ? ? ? ? ?  ?  ? ? ? ? ? ? ? ? ?Anesthesia Physical ? ?Anesthesia Plan ? ?ASA: 2 ? ?Anesthesia Plan: MAC  ? ?Post-op Pain Management: Minimal or no pain anticipated  ? ?Induction:  ? ?PONV Risk Score and Plan: 2 and Treatment may vary due to age or medical condition and TIVA ? ?Airway Management Planned:  ? ?Additional Equipment:  ? ?Intra-op Plan:  ? ?Post-operative Plan:  ? ?Informed Consent: I have reviewed the patients History and Physical, chart, labs and discussed the procedure including the risks, benefits and alternatives for the proposed anesthesia with the patient or authorized representative who has indicated his/her understanding and acceptance.  ? ? ? ?Dental Advisory Given ? ?Plan Discussed with: CRNA ? ?Anesthesia Plan Comments:   ? ? ? ? ? ? ?Anesthesia Quick Evaluation ? ?

## 2021-07-21 NOTE — Transfer of Care (Signed)
Immediate Anesthesia Transfer of Care Note ? ?Patient: Jamie Fry ? ?Procedure(s) Performed: CATARACT EXTRACTION PHACO AND INTRAOCULAR LENS PLACEMENT (IOC) LEFT diabetic (Left: Eye) ? ?Patient Location: PACU ? ?Anesthesia Type: MAC ? ?Level of Consciousness: awake, alert  and patient cooperative ? ?Airway and Oxygen Therapy: Patient Spontanous Breathing and Patient connected to supplemental oxygen ? ?Post-op Assessment: Post-op Vital signs reviewed, Patient's Cardiovascular Status Stable, Respiratory Function Stable, Patent Airway and No signs of Nausea or vomiting ? ?Post-op Vital Signs: Reviewed and stable ? ?Complications: No notable events documented. ? ?

## 2021-07-21 NOTE — Anesthesia Procedure Notes (Signed)
Procedure Name: [place] ?Date/Time: 07/21/2021 7:40 AM ?Performed by: Jeannene Patella, CRNA ?Pre-anesthesia Checklist: Patient identified, Emergency Drugs available, Suction available, Timeout performed and Patient being monitored ?Patient Re-evaluated:Patient Re-evaluated prior to induction ?Oxygen Delivery Method: Nasal cannula ?Placement Confirmation: positive ETCO2 ? ? ? ? ?

## 2021-07-21 NOTE — H&P (Signed)
?[facility]  ? ?Primary Care Physician:  Lauro Regulus, MD ?Ophthalmologist: Dr. Lockie Mola ? ?Pre-Procedure History & Physical: ?HPI:  Jamie Fry is a 81 y.o. female here for ophthalmic surgery. ?  ?Past Medical History:  ?Diagnosis Date  ? COVID-19 05/2021  ? Diabetes mellitus without complication (HCC)   ? Family history of adverse reaction to anesthesia   ? Mother and daughter - PONV  ? Hypertension   ? ? ?Past Surgical History:  ?Procedure Laterality Date  ? BREAST BIOPSY Left 1992  ? negative  ? CATARACT EXTRACTION W/PHACO Right 07/07/2021  ? Procedure: CATARACT EXTRACTION PHACO AND INTRAOCULAR LENS PLACEMENT (IOC)RIGHT;  Surgeon: Lockie Mola, MD;  Location: [facility];  Service: Ophthalmology;  Laterality: Right;  Diabetic ?9.55 ?01:04.8  ? CHOLECYSTECTOMY    ? OPEN REDUCTION INTERNAL FIXATION (ORIF) DISTAL RADIAL FRACTURE Left 03/02/2017  ? Procedure: OPEN REDUCTION INTERNAL FIXATION (ORIF) DISTAL RADIAL FRACTURE;  Surgeon: Kennedy Bucker, MD;  Location: [facility] ORS;  Service: Orthopedics;  Laterality: Left;  ? ? ?Prior to Admission medications   ?Medication Sig Start Date End Date Taking? Authorizing Provider  ?aspirin EC 81 MG tablet Take 81 mg by mouth daily.   Yes [provider]  ?atorvastatin (LIPITOR) 10 MG tablet Take 10 mg by mouth every evening.   Yes [provider]  ?CALCIUM PO Take 600 mg by mouth daily.   Yes [provider]  ?Cholecalciferol (VITAMIN D PO) Take 1 capsule by mouth daily.   Yes [provider]  ?Chromium Picolinate 200 MCG TABS Take by mouth daily.   Yes [provider]  ?Coenzyme Q10 (COQ10) 100 MG CAPS Take 100 mg by mouth daily.   Yes [provider]  ?Flaxseed, Linseed, (FLAX SEED OIL) 1300 MG CAPS Take 1,300 mg by mouth daily.   Yes [provider]  ?Glucosamine-Chondroit-Vit C-Mn (GLUCOSAMINE 1500 COMPLEX PO) Take 1 tablet by mouth daily.   Yes [provider]  ?ibuprofen (ADVIL,MOTRIN) 200 MG tablet Take 200-400 mg by mouth daily as needed for headache or moderate pain.   Yes [provider]  ?Lecithin 1200 MG CAPS Take 1,200 mg by mouth daily.   Yes [provider]  ?lisinopril (PRINIVIL,ZESTRIL) 5 MG tablet Take 7.5 mg by mouth daily.   Yes [provider]  ?metFORMIN (GLUCOPHAGE) 500 MG tablet Take 500 mg by mouth daily with breakfast. 01/27/17  Yes [provider]  ?Multiple Vitamin (MULTIVITAMIN WITH MINERALS) TABS tablet Take 1 tablet by mouth daily.   Yes [provider]  ?nitrofurantoin (MACRODANTIN) 50 MG capsule Take 1 capsule (50 mg total) by mouth daily. 01/24/18  Yes Sondra Come, MD  ?Omega-3 Fatty Acids (FISH OIL) 1000 MG CAPS Take 1,000 mg by mouth daily.   Yes [provider]  ?OVER THE COUNTER MEDICATION Beet root 1000 mg   Yes [provider]  ?vitamin C (ASCORBIC ACID) 500 MG tablet Take 500 mg by mouth daily.   Yes [provider]  ?vitamin E 400 UNIT capsule Take 400 Units by mouth daily.   Yes [provider]  ? ? ?Allergies as of 12/23/2020  ? (No Known Allergies)  ? ? ?Family History  ?Problem Relation Age of Onset  ? Prostate cancer Neg Hx   ? Kidney cancer Neg Hx   ? Bladder Cancer Neg Hx   ? Breast cancer Neg Hx   ? ? ?Social History  ? ?Socioeconomic History  ? Marital status:  Married  ?  Spouse name: Not on file  ? Number of children: Not on file  ? Years of education: Not on file  ? Highest education level: Not on file  ?Occupational History  ? Not on file  ?Tobacco Use  ? Smoking status: Never  ? Smokeless tobacco: Never  ?Vaping Use  ? Vaping Use: Never used  ?Substance and Sexual Activity  ? Alcohol use: No  ? Drug use: No  ? Sexual activity: Not on file  ?Other Topics Concern  ? Not on file  ?Social History Narrative  ? Not on file  ? ?Social Determinants of Health  ? ?Financial Resource Strain: Not on file  ?Food Insecurity: Not on file   ?Transportation Needs: Not on file  ?Physical Activity: Not on file  ?Stress: Not on file  ?Social Connections: Not on file  ?Intimate Partner Violence: Not on file  ? ? ?Review of Systems: ?See HPI, otherwise negative ROS ? ?Physical Exam: ?BP (!) 158/84   Pulse 83   Temp (!) 97.3 ?F (36.3 ?C) (Temporal)   Resp (!) 28   Ht 5\' 3"  (1.6 m)   Wt 62.1 kg   SpO2 96%   BMI 24.27 kg/m?  ?General:   Alert,  pleasant and cooperative in NAD ?Head:  Normocephalic and atraumatic. ?Lungs:  Clear to auscultation.    ?Heart:  Regular rate and rhythm.  ? ?Impression/Plan: ?Jamie Fry is here for ophthalmic surgery. ? ?Risks, benefits, limitations, and alternatives regarding ophthalmic surgery have been reviewed with the patient.  Questions have been answered.  All parties agreeable. ? ? Orvan Falconer, MD  07/21/2021, 7:32 AM ? ?

## 2021-07-21 NOTE — Op Note (Signed)
OPERATIVE NOTE ? ?KELSIE ZABOROWSKI ?[id number] ?07/21/2021 ? ? ?PREOPERATIVE DIAGNOSIS:  Nuclear sclerotic cataract left eye. H25.12 ?  ?POSTOPERATIVE DIAGNOSIS:    Nuclear sclerotic cataract left eye.   ?  ?PROCEDURE:  Phacoemusification with posterior chamber intraocular lens placement of the left eye  ?Ultrasound time: Procedure(s) with comments: ?CATARACT EXTRACTION PHACO AND INTRAOCULAR LENS PLACEMENT (IOC) LEFT diabetic (Left) - Diabetic ?6.25 ?01:08.1 ? ?LENS:   ?Implant Name Type Inv. Item Serial No. Manufacturer Lot No. LRB No. Used Action  ?LENS IOL TECNIS EYHANCE 19.0 - [id number] Intraocular Lens LENS IOL TECNIS EYHANCE 19.0 [id number] [employer]  Left 1 Implanted  ?   ? ?SURGEON:  Deirdre Evener, MD ?  ?ANESTHESIA:  Topical with tetracaine drops and 2% Xylocaine jelly, augmented with 1% preservative-free intracameral lidocaine. ? ?  ?COMPLICATIONS:  None. ?  ?DESCRIPTION OF PROCEDURE:  The patient was identified in the holding room and transported to the operating room and placed in the supine position under the operating microscope.  The left eye was identified as the operative eye and it was prepped and draped in the usual sterile ophthalmic fashion. ?  ?A 1 millimeter clear-corneal paracentesis was made at the 1:30 position.  0.5 ml of preservative-free 1% lidocaine was injected into the anterior chamber. ? The anterior chamber was filled with Viscoat viscoelastic.  A 2.4 millimeter keratome was used to make a near-clear corneal incision at the 10:30 position.  .  A curvilinear capsulorrhexis was made with a cystotome and capsulorrhexis forceps.  Balanced salt solution was used to hydrodissect and hydrodelineate the nucleus. ?  ?Phacoemulsification was then used in stop and chop fashion to remove the lens nucleus and epinucleus.  The remaining cortex was then removed using the irrigation and aspiration handpiece. Provisc was then placed into the capsular bag to distend it for lens placement.  A  lens was then injected into the capsular bag.  The remaining viscoelastic was aspirated. ?  ?Wounds were hydrated with balanced salt solution.  The anterior chamber was inflated to a physiologic pressure with balanced salt solution.  No wound leaks were noted. Cefuroxime 0.1 ml of a 10mg /ml solution was injected into the anterior chamber for a dose of 1 mg of intracameral antibiotic at the completion of the case. ?  Timolol and Brimonidine drops were applied to the eye.  The patient was taken to the recovery room in stable condition without complications of anesthesia or surgery. ? ?Vennie Waymire ?07/21/2021, 7:54 AM ? ?

## 2021-07-21 NOTE — Anesthesia Postprocedure Evaluation (Addendum)
Anesthesia Post Note ? ?Patient: Jamie Fry ? ?Procedure(s) Performed: CATARACT EXTRACTION PHACO AND INTRAOCULAR LENS PLACEMENT (IOC) LEFT diabetic (Left: Eye) ? ? ?  ?Patient location during evaluation: PACU ?Anesthesia Type: MAC ?Level of consciousness: awake and alert and oriented ?Pain management: satisfactory to patient ?Vital Signs Assessment: post-procedure vital signs reviewed and stable ?Respiratory status: spontaneous breathing, nonlabored ventilation and respiratory function stable ?Cardiovascular status: blood pressure returned to baseline and stable ?Postop Assessment: Adequate PO intake and No signs of nausea or vomiting ?Anesthetic complications: no ? ? ?No notable events documented. ? ?Raliegh Ip ? ? ? ? ? ?

## 2021-07-21 NOTE — Addendum Note (Signed)
Addendum  created 07/21/21 1038 by Ranee Gosselin, MD  ? Clinical Note Signed  ?  ?

## 2021-07-22 ENCOUNTER — Encounter: Payer: Self-pay | Admitting: Ophthalmology

## 2022-01-03 IMAGING — MG DIGITAL SCREENING BILAT W/ TOMO W/ CAD
8 series · 9 of 24 positions shown · non-contrast
Comparison: Previous exam(s).

CLINICAL DATA: Screening.

EXAM:
DIGITAL SCREENING BILATERAL MAMMOGRAM WITH TOMO AND CAD

[R CC synth-2D]
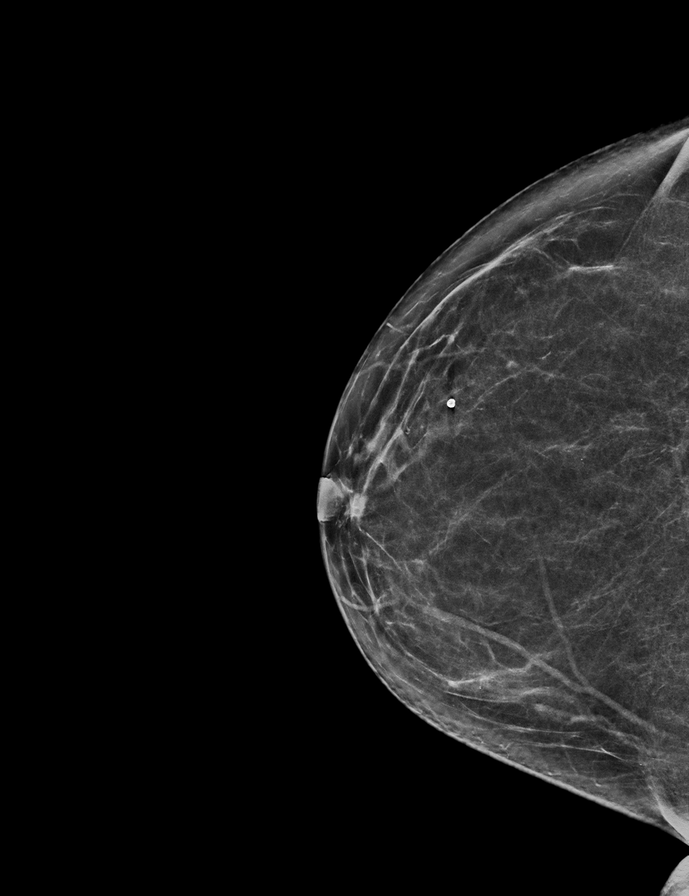

[L MLO synth-2D]
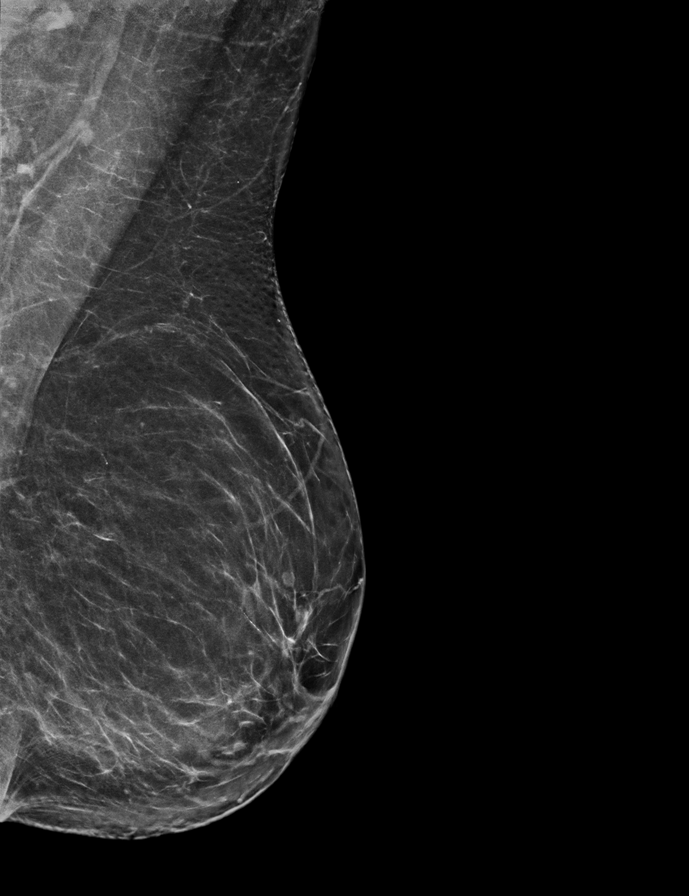

[R MLO synth-2D]
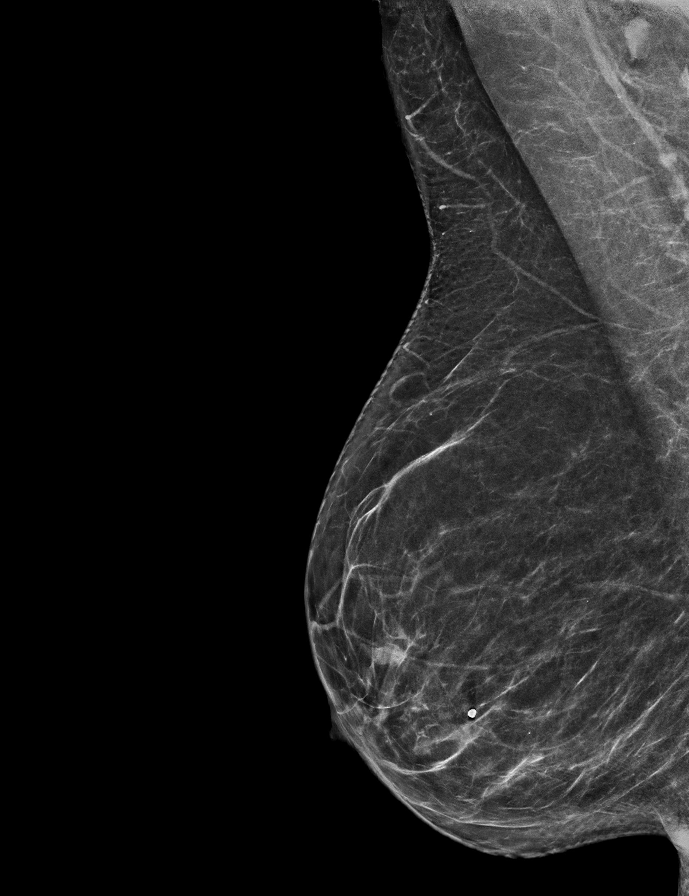

[L CC synth-2D]
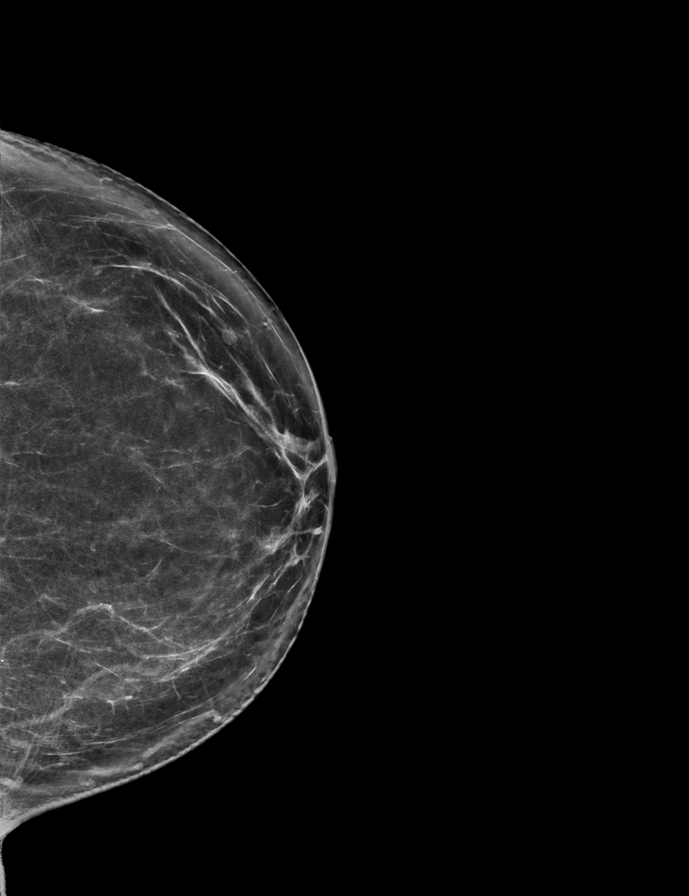

[L MLO tomo · 2 of 72 frames shown]
[frame 24/72]
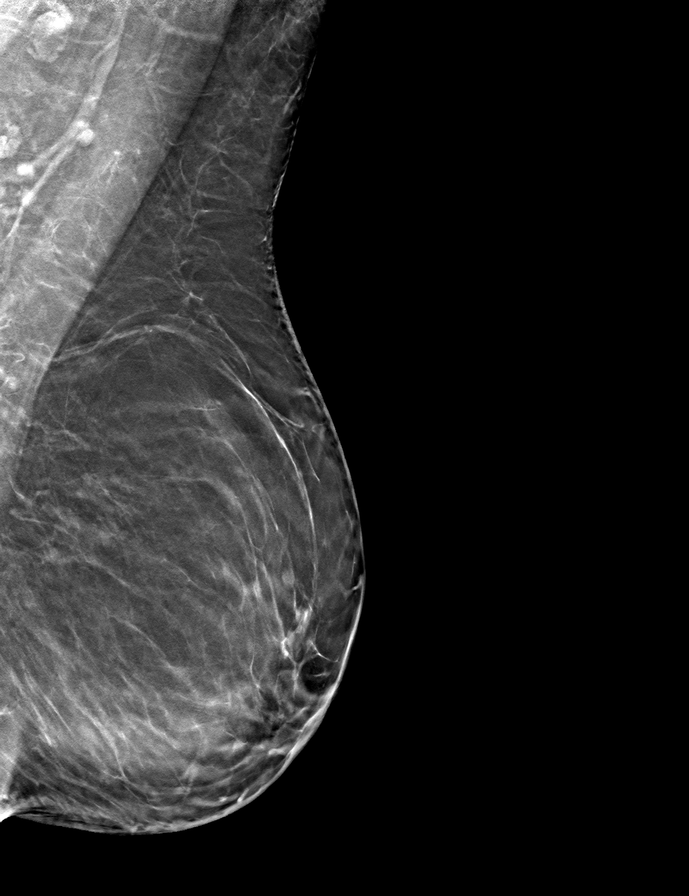
[frame 37/72]
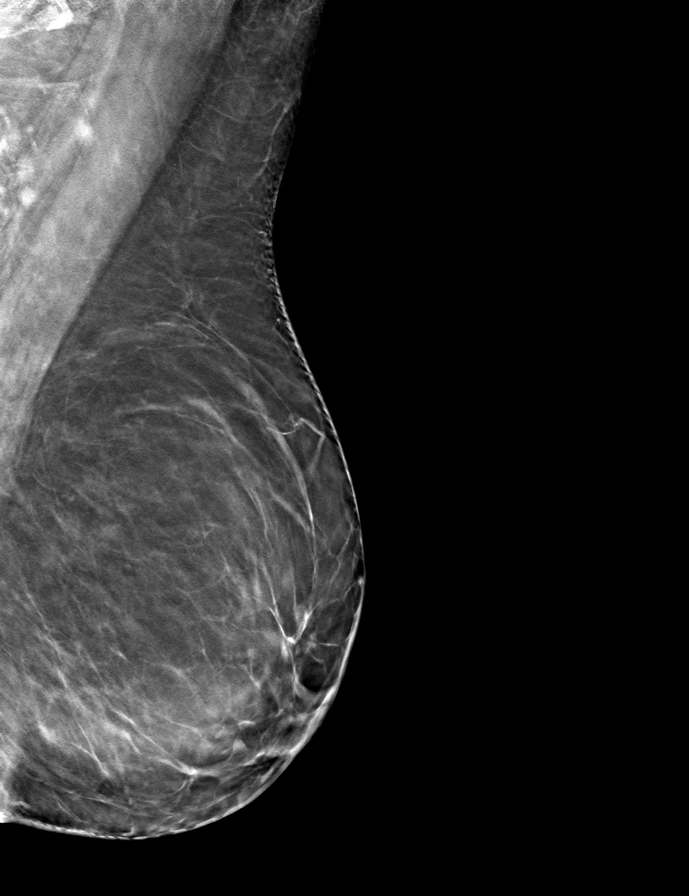

[R CC tomo · tomo slice 32/63.0]
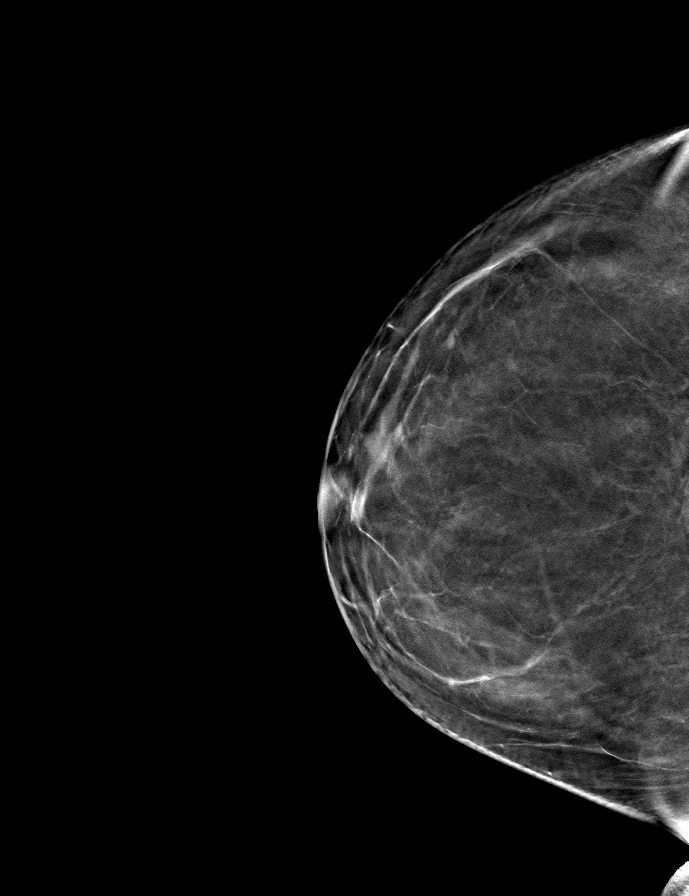

[R MLO tomo · tomo slice 33/65.0]
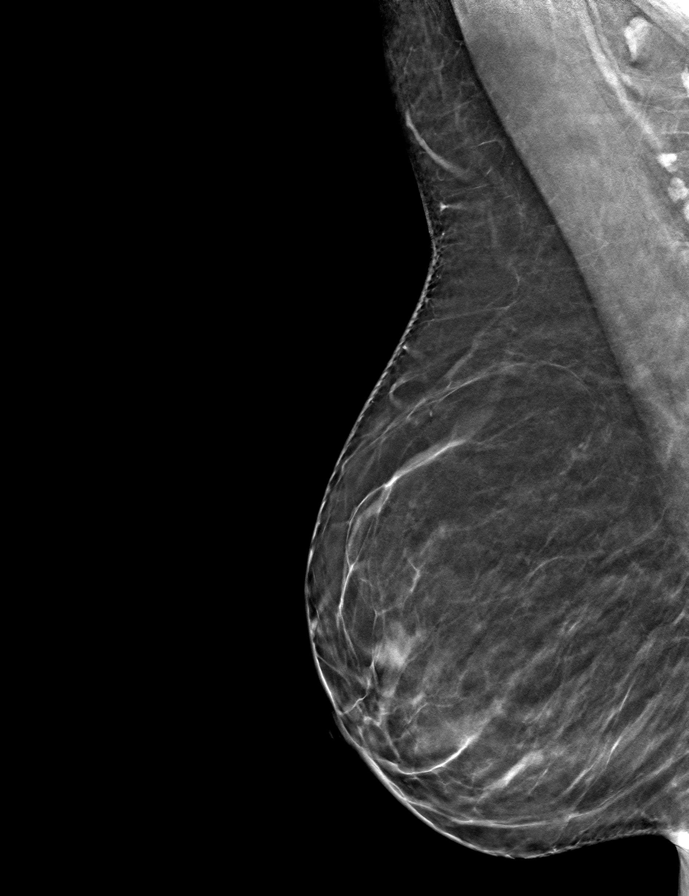

[L CC tomo · tomo slice 35/70.0]
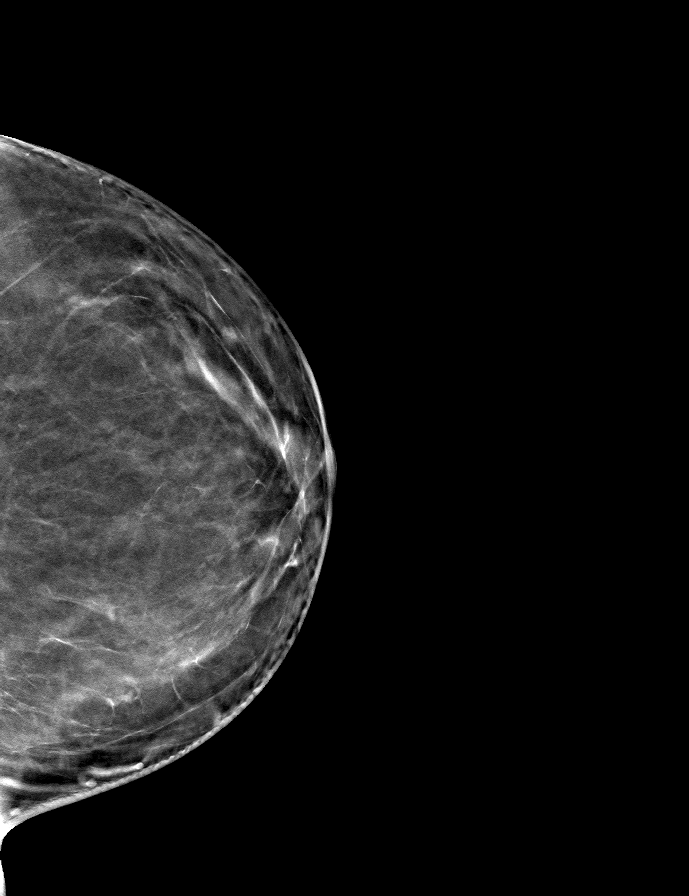

[9 of 24 positions shown; findings below may reference images not displayed]

ACR Breast Density Category b: There are scattered areas of
fibroglandular density.
FINDINGS: There are no findings suspicious for malignancy. Images were
processed with CAD.
IMPRESSION: No mammographic evidence of malignancy. A result letter of this
screening mammogram will be mailed directly to the patient.

RECOMMENDATION:
Screening mammogram in one year. (Code:[id number])

BI-RADS CATEGORY  1: Negative.

## 2023-01-03 ENCOUNTER — Other Ambulatory Visit: Payer: Self-pay | Admitting: Obstetrics and Gynecology

## 2023-01-03 DIAGNOSIS — Z1231 Encounter for screening mammogram for malignant neoplasm of breast: Secondary | ICD-10-CM

## 2023-05-30 ENCOUNTER — Ambulatory Visit
Admission: RE | Admit: 2023-05-30 | Discharge: 2023-05-30 | Disposition: A | Discharge status: Home / Self Care | Payer: Medicare Other | Source: Ambulatory Visit | Attending: Obstetrics and Gynecology | Admitting: Obstetrics and Gynecology

## 2023-05-30 DIAGNOSIS — Z1231 Encounter for screening mammogram for malignant neoplasm of breast: Secondary | ICD-10-CM | POA: Diagnosis present
